# Patient Record
Sex: Female | Born: 1995 | Marital: Single | Smoking: Never smoker
Health system: Southern US, Community
[De-identification: ages and names within clinical notes are randomized; demographics above are authoritative.]

## PROBLEM LIST (undated history)

## (undated) DIAGNOSIS — R112 Nausea with vomiting, unspecified: Secondary | ICD-10-CM

## (undated) DIAGNOSIS — F32A Depression, unspecified: Secondary | ICD-10-CM

## (undated) DIAGNOSIS — F329 Major depressive disorder, single episode, unspecified: Secondary | ICD-10-CM

## (undated) DIAGNOSIS — F419 Anxiety disorder, unspecified: Secondary | ICD-10-CM

## (undated) DIAGNOSIS — Z9889 Other specified postprocedural states: Secondary | ICD-10-CM

## (undated) HISTORY — PX: FOOT SURGERY: SHX648

---
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
## 2015-02-17 NOTE — L&D Delivery Note (Signed)
[id number] - Pt called for possible SROM, in to assist pt to BR. Pt delivered a non viable fetus in BR at 0058. Dr. Genevie AnnSchenk called to evaluate in person.

## 2016-02-01 ENCOUNTER — Inpatient Hospital Stay (HOSPITAL_COMMUNITY)
Admission: AD | Admit: 2016-02-01 | Discharge: 2016-02-02 | Disposition: A | Discharge status: Home / Self Care | Payer: Managed Care, Other (non HMO) | Source: Ambulatory Visit | Attending: Obstetrics & Gynecology | Admitting: Obstetrics & Gynecology

## 2016-02-01 ENCOUNTER — Inpatient Hospital Stay (HOSPITAL_COMMUNITY): Payer: Managed Care, Other (non HMO)

## 2016-02-01 ENCOUNTER — Encounter (HOSPITAL_COMMUNITY): Payer: Self-pay | Admitting: *Deleted

## 2016-02-01 DIAGNOSIS — O039 Complete or unspecified spontaneous abortion without complication: Secondary | ICD-10-CM

## 2016-02-01 DIAGNOSIS — Z3A16 16 weeks gestation of pregnancy: Secondary | ICD-10-CM | POA: Insufficient documentation

## 2016-02-01 DIAGNOSIS — O021 Missed abortion: Secondary | ICD-10-CM | POA: Insufficient documentation

## 2016-02-01 DIAGNOSIS — O283 Abnormal ultrasonic finding on antenatal screening of mother: Secondary | ICD-10-CM

## 2016-02-01 HISTORY — DX: Nausea with vomiting, unspecified: R11.2

## 2016-02-01 HISTORY — DX: Depression, unspecified: F32.A

## 2016-02-01 HISTORY — DX: Anxiety disorder, unspecified: F41.9

## 2016-02-01 HISTORY — DX: Major depressive disorder, single episode, unspecified: F32.9

## 2016-02-01 HISTORY — DX: Other specified postprocedural states: Z98.890

## 2016-02-01 LAB — CBC
HCT: 39.7 % (ref 36.0–46.0)
Hemoglobin: 13.8 g/dL (ref 12.0–15.0)
MCH: 29.3 pg (ref 26.0–34.0)
MCHC: 34.8 g/dL (ref 30.0–36.0)
MCV: 84.3 fL (ref 78.0–100.0)
PLATELETS: 283 10*3/uL (ref 150–400)
RBC: 4.71 MIL/uL (ref 3.87–5.11)
RDW: 13.1 % (ref 11.5–15.5)
WBC: 10 10*3/uL (ref 4.0–10.5)

## 2016-02-01 LAB — TYPE AND SCREEN
ABO/RH(D): O POS
ANTIBODY SCREEN: NEGATIVE

## 2016-02-01 LAB — ABO/RH: ABO/RH(D): O POS

## 2016-02-01 IMAGING — US US MFM OB LIMITED
1 series · 15 of 28 positions shown · non-contrast
Comparison: none

[Series 1: us mfm ob limited · 15 of 33 slices shown]
[im 1/33]
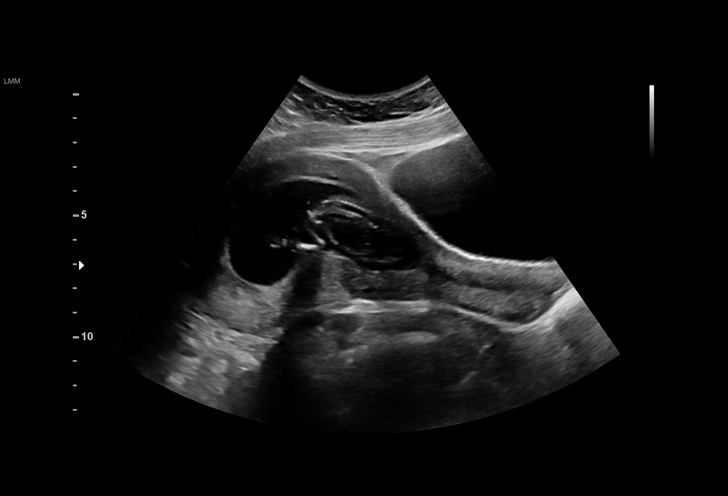
[im 3/33]
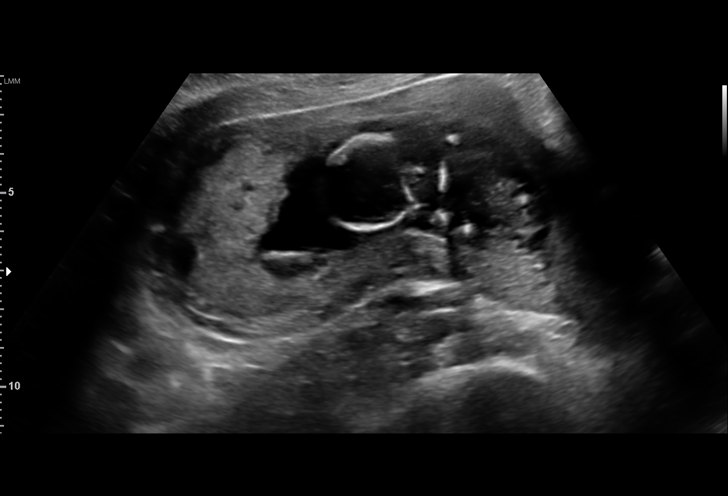
[im 5/33]
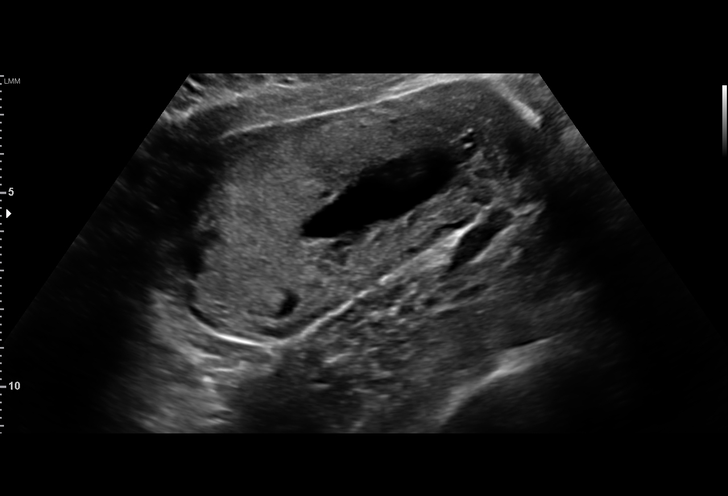
[im 8/33]
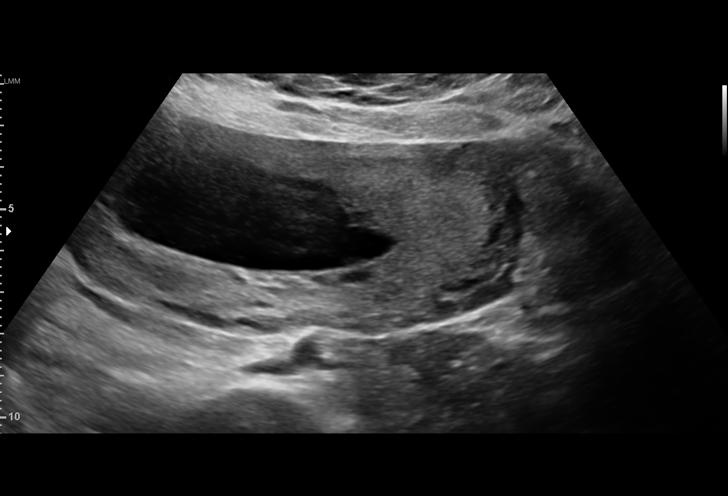
[im 10/33]
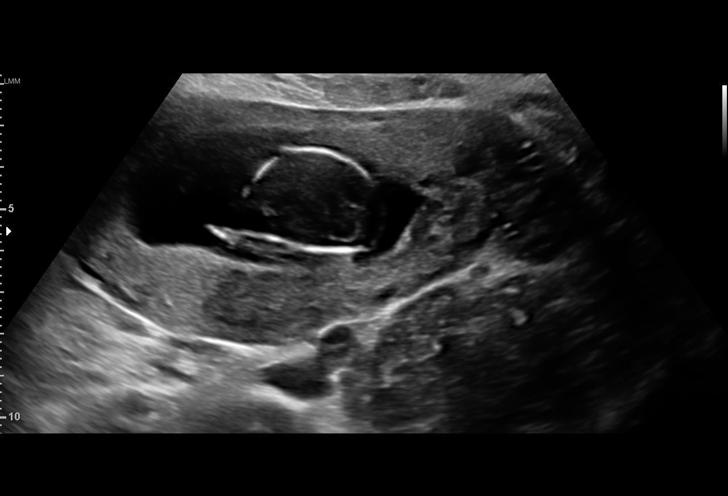
[im 12/33]
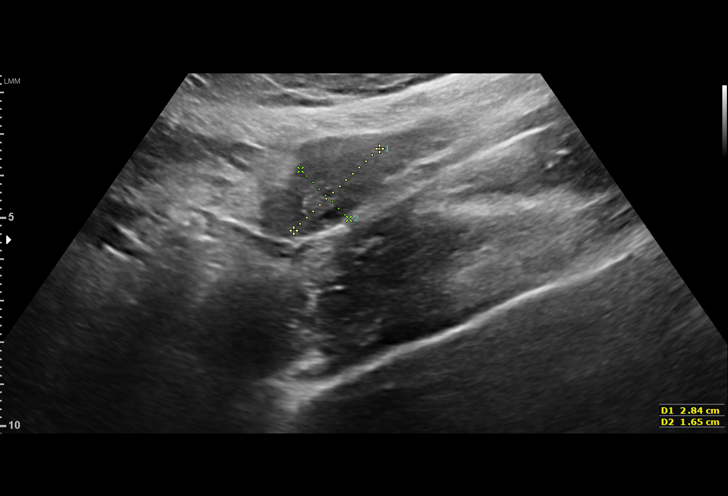
[im 15/33]
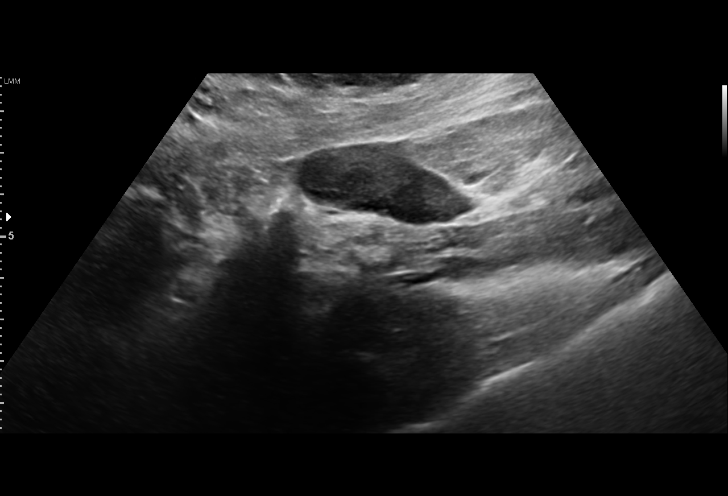
[im 17/33]
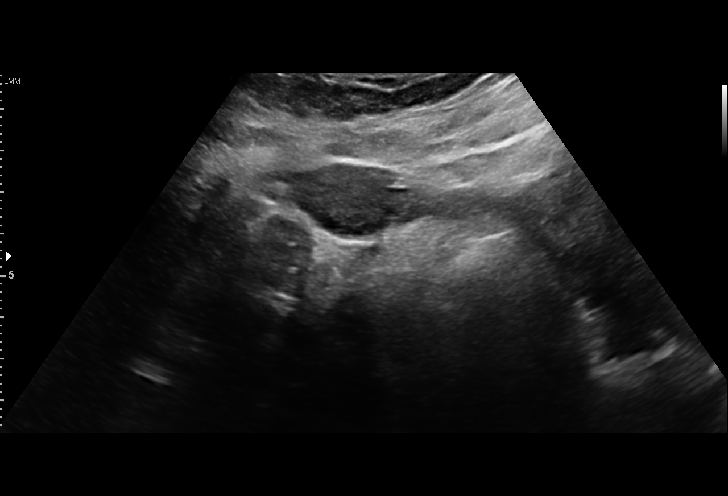
[im 18/33]
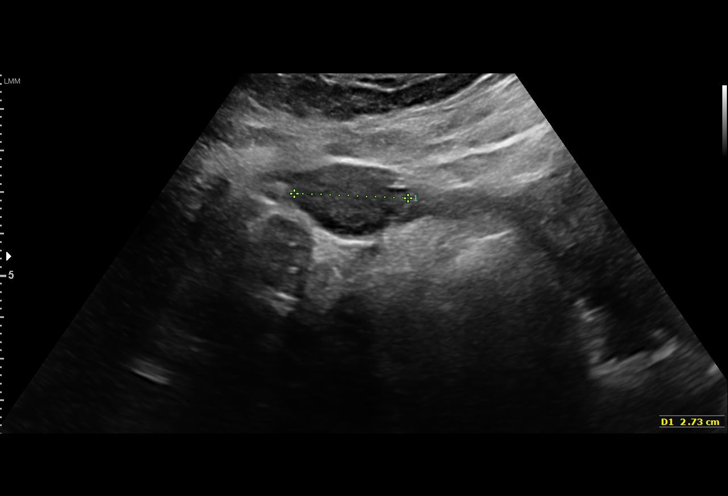
[im 21/33]
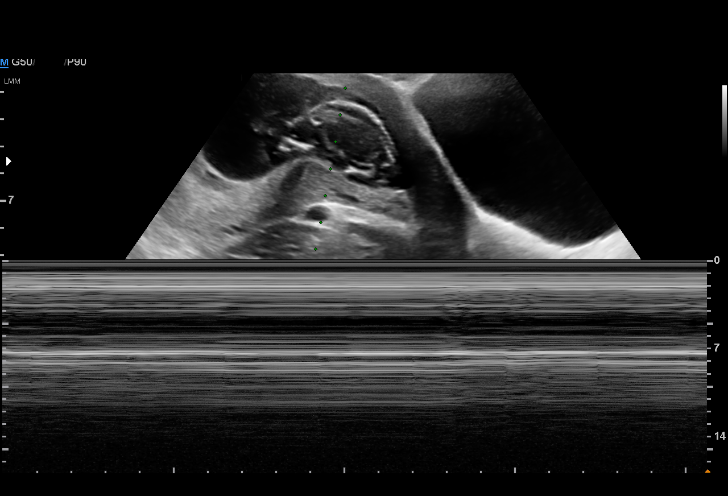
[im 23/33]
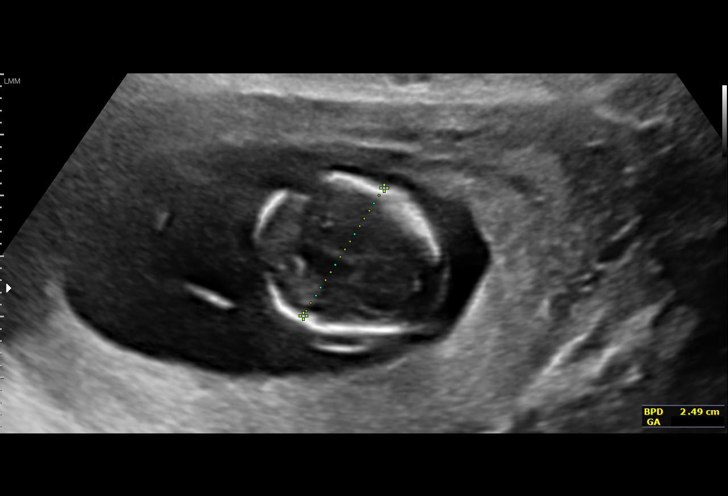
[im 25/33]
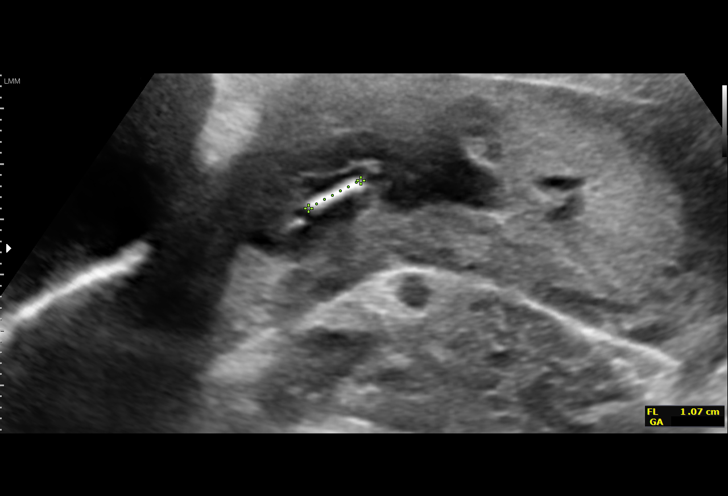
[im 28/33]
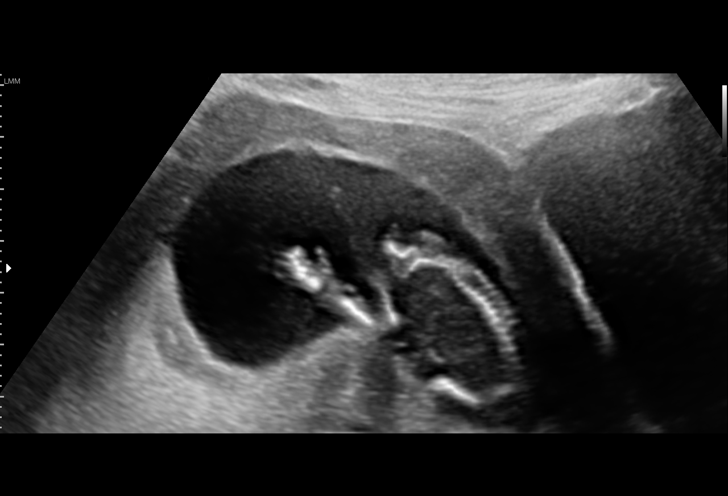
[im 30/33]
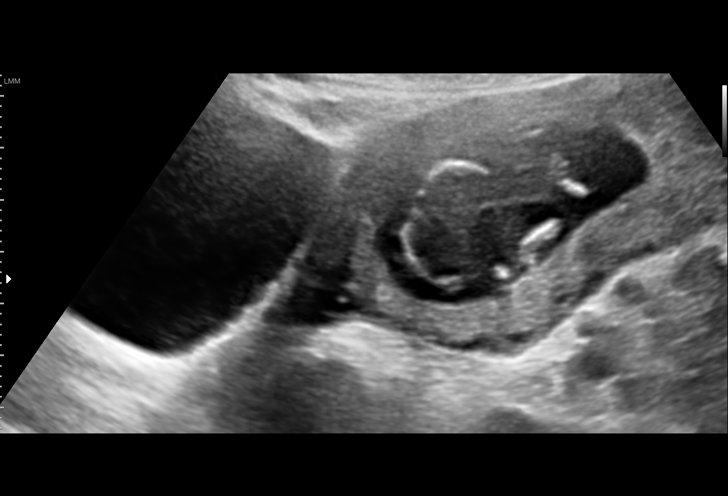
[im 33/33]
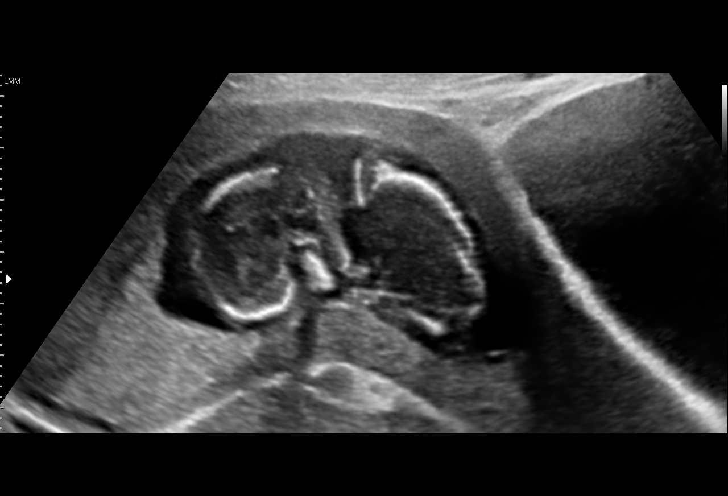

[15 of 28 positions shown; findings below may reference images not displayed]

Attending:        JURUBEBA        Secondary Phy.:    JURUBEBA Nursing-
MAU/Triage

Indications

16 weeks gestation of pregnancy
Absent fetal heart tones                       O76
OB History

Gravidity:    3          SAB:   2
Living:       0
Fetal Evaluation

Num Of Fetuses:     1
Cardiac Activity:   Absent
Presentation:       Breech
Placenta:           Fundal, above cervical os

Amniotic Fluid
AFI FV:      Subjectively within normal limits
Biometry

BPD:      24.8  mm     G. Age:  14w 2d          1  %
FL/BPD:      42.3  %
FL:       10.5  mm     G. Age:  13w 1d        < 3  %
Gestational Age

LMP:           16w 0d        Date:  [DATE]                 EDD:   [DATE]
U/S Today:     13w 5d                                        EDD:   [DATE]
Best:          16w 0d     Det. By:  LMP  ([DATE])          EDD:   [DATE]
Cervix Uterus Adnexa

Cervix
Normal appearance by transabdominal scan.

Uterus
No abnormality visualized.

Left Ovary
Within normal limits.

Right Ovary
Within normal limits.

Cul De Sac:   No free fluid seen.

Adnexa:       No abnormality visualized.
Impression

Single IUP at 16w 0d
BPD and FL consistent with 13w 5d
Absent fetal heart tones - consistent with intrauterine fetal
demise
Recommendations

Follow up ultrasounds as clinically indicated

## 2016-02-01 MED ORDER — SODIUM CHLORIDE 0.9% FLUSH
3.0000 mL | Freq: Two times a day (BID) | INTRAVENOUS | Status: DC
  Administered 2016-02-01 – 2016-02-02 (×2): 3 mL via INTRAVENOUS

## 2016-02-01 MED ORDER — SODIUM CHLORIDE 0.9% FLUSH: 3.0000 mL | INTRAVENOUS | Status: DC | PRN

## 2016-02-01 MED ORDER — MISOPROSTOL 200 MCG PO TABS
800.0000 ug | ORAL_TABLET | Freq: Once | ORAL | Status: AC
  Administered 2016-02-01: 800 ug via VAGINAL
  Filled 2016-02-01: qty 4

## 2016-02-01 MED ORDER — SODIUM CHLORIDE 0.9 % IV SOLN: 250.0000 mL | INTRAVENOUS | Status: DC | PRN

## 2016-02-01 MED ORDER — ZOLPIDEM TARTRATE 5 MG PO TABS: 5.0000 mg | ORAL_TABLET | Freq: Every evening | ORAL | Status: DC | PRN

## 2016-02-01 MED ORDER — ACETAMINOPHEN 325 MG PO TABS
650.0000 mg | ORAL_TABLET | ORAL | Status: DC | PRN
  Administered 2016-02-01: 650 mg via ORAL
  Filled 2016-02-01: qty 2

## 2016-02-01 MED ORDER — DOCUSATE SODIUM 100 MG PO CAPS: 100.0000 mg | ORAL_CAPSULE | Freq: Every day | ORAL | Status: DC

## 2016-02-01 MED ORDER — CALCIUM CARBONATE ANTACID 500 MG PO CHEW: 2.0000 | CHEWABLE_TABLET | ORAL | Status: DC | PRN

## 2016-02-01 MED ORDER — FENTANYL CITRATE (PF) 100 MCG/2ML IJ SOLN
100.0000 ug | INTRAMUSCULAR | Status: DC | PRN
  Administered 2016-02-01 – 2016-02-02 (×6): 100 ug via INTRAVENOUS
  Filled 2016-02-01 (×6): qty 2

## 2016-02-01 MED ORDER — SERTRALINE HCL 50 MG PO TABS
50.0000 mg | ORAL_TABLET | Freq: Every day | ORAL | Status: DC
  Administered 2016-02-01: 50 mg via ORAL
  Filled 2016-02-01: qty 1

## 2016-02-01 MED ORDER — MISOPROSTOL 200 MCG PO TABS
400.0000 ug | ORAL_TABLET | Freq: Once | ORAL | Status: AC
  Administered 2016-02-02: 400 ug via VAGINAL
  Filled 2016-02-01: qty 2

## 2016-02-01 NOTE — MAU Note (Signed)
Pt presents to MAU after [facility] for gender reveal and states was told by ultrasound tech they could not find a heart rate. Denies any vaginal bleeding or pain. States she had a subchronic hemorrhage at [redacted] weeks gestation that resolved

## 2016-02-01 NOTE — Progress Notes (Signed)
Patient requested to eat prior to the placement of cytotec.  Family is at bedside.  Chaplain was paged.

## 2016-02-01 NOTE — Progress Notes (Signed)
Chaplain paged to provide grief support and care to Veronica Wolfe after the news that her child was a fetal demise. Veronica Wolfe was surrounded by mother, sisters and SO. She went to heartstrings to find what gender her child was and it was discovered there was no heart beat. The other shock came when she was told that the child was six weeks less developed that she had thought, therefore rather than [redacted] weeks pregnant she is no more than [redacted] weeks pregnant. Grief counsel was limited to the initial shock she is feeling now. I will follow up on Sunday when she has had time to absorb her loss. Her SO was present but unemotional.  Page Chaplain if Veronica Wolfe needs further Spiritual Care prior to rounding on the 17th of Dec.  Darice Vicario D. Batsheva Stevick, DMin Chaplain

## 2016-02-01 NOTE — MAU Provider Note (Signed)
Chief Complaint: No chief complaint on file.   None     SUBJECTIVE HPI: Veronica Wolfe is a 20 y.o. G3P0020 at 1941w0d by LMP who presents to maternity admissions reporting [facility] and was told there was no fetal heartbeat.  [place] [facility] [place] but was in [place] when she received this news. She denies pain or bleeding.  Her pregnancy has been uncomplicated with hx of 2 previous SABs in the first trimester. She denies vaginal bleeding, vaginal itching/burning, urinary symptoms, h/a, dizziness, n/v, or fever/chills.     HPI  History reviewed. No pertinent past medical history. Past Surgical History:  Procedure Laterality Date  . FOOT SURGERY     Social History   Social History  . Marital status: Single    Spouse name: N/A  . Number of children: N/A  . Years of education: N/A   Occupational History  . Not on file.   Social History Main Topics  . Smoking status: Never Smoker  . Smokeless tobacco: Never Used  . Alcohol use Not on file  . Drug use: No  . Sexual activity: Yes    Birth control/ protection: None   Other Topics Concern  . Not on file   Social History Narrative  . No narrative on file   No current facility-administered medications on file prior to encounter.    No current outpatient prescriptions on file prior to encounter.   No Known Allergies  ROS:  Review of Systems  Constitutional: Negative for chills, fatigue and fever.  Eyes: Negative for visual disturbance.  Respiratory: Negative for shortness of breath.   Cardiovascular: Negative for chest pain.  Gastrointestinal: Negative for abdominal pain, nausea and vomiting.  Genitourinary: Negative for difficulty urinating, dysuria, flank pain, pelvic pain, vaginal bleeding, vaginal discharge and vaginal pain.  Neurological: Negative for dizziness and headaches.  Psychiatric/Behavioral: Negative.      I have reviewed  patient's Past Medical Hx, Surgical Hx, Family Hx, Social Hx, medications and allergies.   Physical Exam   Patient Vitals for the past 24 hrs:  BP Temp Pulse Resp SpO2  02/01/16 1045 122/70 97.9 F (36.6 C) 120 18 100 %   Constitutional: Well-developed, well-nourished female in no acute distress.  Cardiovascular: normal rate Respiratory: normal effort GI: Abd soft, non-tender. Pos BS x 4 MS: Extremities nontender, no edema, normal ROM Neurologic: Alert and oriented x 4.  GU: Neg CVAT.    LAB RESULTS No results found for this or any previous visit (from the past 24 hour(s)).     IMAGING No results found.  Bedside US with no visible FHR, formal US ordered and completed at bedside for confirmation.  Measuring less than dates, at 13-14 weeks per ultrasound.  MAU Management/MDM: Ordered US and reviewed results.  Discussed findings with pt as definitive for fetal demise, likely occurring a few weeks ago according to today's measurements.  Discussed options including expectant management and induction of labor with Cytotec, as well as possible surgical options for D&E depending on final measurements of CRL/dating.  Pt does not desire surgery. She is unsure if she wants to stay here in [place] or [place].  Pt and family given time to discuss and decide. Questions answered. Consult Dr Debroah LoopArnold.  Pt desires to stay to [facility].  Admit to HROB 3rd floor.  Pt stable at time of transfer.  ASSESSMENT 1.  Abnormal pregnancy US   2. Fetal demise due to miscarriage     PLAN Admit to HROB 3rd floor Cytotec 800 mcg vaginally x 1 dose Saline lock with Fentanyl 100 mcg Q 1 hour PRN Admission labs drawn    Sharen CounterLisa Leftwich-Kirby Certified Nurse-Midwife 02/01/2016  2:17 PM

## 2016-02-02 ENCOUNTER — Encounter (HOSPITAL_COMMUNITY): Payer: Self-pay | Admitting: Certified Registered Nurse Anesthetist

## 2016-02-02 ENCOUNTER — Encounter (HOSPITAL_COMMUNITY)
Admission: AD | Disposition: A | Discharge status: Home / Self Care | Payer: Self-pay | Source: Ambulatory Visit | Attending: Obstetrics & Gynecology

## 2016-02-02 DIAGNOSIS — O021 Missed abortion: Secondary | ICD-10-CM

## 2016-02-02 SURGERY — DILATION AND EVACUATION, UTERUS
Anesthesia: Choice

## 2016-02-02 MED ORDER — MISOPROSTOL 200 MCG PO TABS
400.0000 ug | ORAL_TABLET | Freq: Once | ORAL | Status: AC
  Administered 2016-02-02: 400 ug via ORAL
  Filled 2016-02-02: qty 2

## 2016-02-02 MED ORDER — MISOPROSTOL 200 MCG PO TABS
ORAL_TABLET | ORAL | Status: AC
  Filled 2016-02-02: qty 2

## 2016-02-02 MED ORDER — ONDANSETRON HCL 4 MG/2ML IJ SOLN
4.0000 mg | Freq: Four times a day (QID) | INTRAMUSCULAR | Status: DC | PRN
  Administered 2016-02-02: 4 mg via INTRAVENOUS
  Filled 2016-02-02: qty 2

## 2016-02-02 MED ORDER — IBUPROFEN 600 MG PO TABS: 600.0000 mg | ORAL_TABLET | Freq: Four times a day (QID) | ORAL | 1 refills | Status: AC | PRN

## 2016-02-02 NOTE — H&P (Signed)
None     SUBJECTIVE HPI: Veronica Wolfe is a 20 y.o. G3P0020 at 7724w0d by LMP who presents to maternity admissions reporting [facility] and was told there was no fetal heartbeat.  She [place] [facility] [place] [facility] [place] but was in [place] when she received this news. She denies pain or bleeding.  Her pregnancy has been uncomplicated with hx of 2 previous SABs in the first trimester. She denies vaginal bleeding, vaginal itching/burning, urinary symptoms, h/a, dizziness, n/v, or fever/chills.     HPI  Past Medical History:  Diagnosis Date  . Anxiety   . Depression   . PONV (postoperative nausea and vomiting)    Past Surgical History:  Procedure Laterality Date  . FOOT SURGERY     Social History   Social History  . Marital status: Single    Spouse name: N/A  . Number of children: N/A  . Years of education: N/A   Occupational History  . Not on file.   Social History Main Topics  . Smoking status: Never Smoker  . Smokeless tobacco: Never Used  . Alcohol use Not on file  . Drug use: No  . Sexual activity: Yes    Birth control/ protection: None   Other Topics Concern  . Not on file   Social History Narrative  . No narrative on file   No current facility-administered medications on file prior to encounter.    No current outpatient prescriptions on file prior to encounter.   No Known Allergies  ROS:  Review of Systems  Constitutional: Negative for chills, fatigue and fever.  Eyes: Negative for visual disturbance.  Respiratory: Negative for shortness of breath.   Cardiovascular: Negative for chest pain.  Gastrointestinal: Negative for abdominal pain, nausea and vomiting.  Genitourinary: Negative for difficulty urinating, dysuria, flank pain, pelvic pain, vaginal bleeding, vaginal discharge and vaginal pain.  Neurological: Negative for dizziness and headaches.  Psychiatric/Behavioral: Negative.      I  have reviewed patient's Past Medical Hx, Surgical Hx, Family Hx, Social Hx, medications and allergies.   Physical Exam   Patient Vitals for the past 24 hrs:  BP Temp Temp src Pulse Resp SpO2 Height Weight  02/02/16 0602 110/68 98.1 F (36.7 C) Oral 90 17 - - -  02/02/16 0532 114/77 - - (!) 102 - - - -  02/02/16 0502 102/60 - - 82 - - - -  02/02/16 0432 (!) 101/54 - - 79 - - - -  02/02/16 0410 - 98.3 F (36.8 C) Oral - - - - -  02/02/16 0402 114/69 - - 80 - - - -  02/02/16 0332 121/71 - - 91 - - - -  02/02/16 0300 126/74 - - 97 - - - -  02/02/16 0232 112/66 - - 88 - - - -  02/02/16 0202 123/77 - - 95 - - - -  02/02/16 0144 119/72 - - 98 - - - -  02/02/16 0103 115/65 - - (!) 114 - - - -  02/02/16 0025 120/73 98.1 F (36.7 C) Oral (!) 114 17 - - -  02/01/16 1930 110/60 98.5 F (36.9 C) Oral - 17 - - -  02/01/16 1640 (!) 103/58 99 F (37.2 C) Oral (!) 101 16 - - -  02/01/16 1441 123/67 98.2 F (36.8 C) Oral (!) 122 16 - 5\' 5"  (1.651 m) 68 kg (150 lb)  02/01/16 1045 122/70 97.9 F (36.6 C) -  120 18 100 % - -   Constitutional: Well-developed, well-nourished female in no acute distress.  Cardiovascular: normal rate Respiratory: normal effort GI: Abd soft, non-tender. Pos BS x 4 MS: Extremities nontender, no edema, normal ROM Neurologic: Alert and oriented x 4.  GU: Neg CVAT.    LAB RESULTS Results for orders placed or performed during the hospital encounter of 02/01/16 (from the past 24 hour(s))  Type and screen [facility]     Status: None   Collection Time: 02/01/16  1:55 PM  Result Value Ref Range   ABO/RH(D) O POS    Antibody Screen NEG    Sample Expiration 02/04/2016   CBC on admission     Status: None   Collection Time: 02/01/16  1:55 PM  Result Value Ref Range   WBC 10.0 4.0 - 10.5 K/uL   RBC 4.71 3.87 - 5.11 MIL/uL   Hemoglobin 13.8 12.0 - 15.0 g/dL   HCT 40.939.7 81.136.0 - 91.446.0 %   MCV 84.3 78.0 - 100.0 fL   MCH 29.3 26.0 - 34.0 pg   MCHC 34.8  30.0 - 36.0 g/dL   RDW 78.213.1 95.611.5 - 21.315.5 %   Platelets 283 150 - 400 K/uL  ABO/Rh     Status: None   Collection Time: 02/01/16  1:55 PM  Result Value Ref Range   ABO/RH(D) O POS     --/--/O POS, O POS (12/16 1355)  IMAGING No results found.  Bedside US with no visible FHR, formal US ordered and completed at bedside for confirmation.  Measuring less than dates, at 13-14 weeks per ultrasound.  MAU Management/MDM: Ordered US and reviewed results.  Discussed findings with pt as definitive for fetal demise, likely occurring a few weeks ago according to today's measurements.  Discussed options including expectant management and induction of labor with Cytotec, as well as possible surgical options for D&E depending on final measurements of CRL/dating.  Pt does not desire surgery. She is unsure if she wants to stay here in [place] or [place] [facility] [place].  Pt and family given time to discuss and decide. Questions answered. Consult Dr Debroah LoopArnold.  Pt desires to stay to [facility].  Admit to HROB 3rd floor.  Pt stable at time of transfer.  ASSESSMENT 1. Abnormal pregnancy US   2. Fetal demise due to miscarriage     PLAN Admit to HROB 3rd floor Cytotec 800 mcg vaginally x 1 dose Saline lock with Fentanyl 100 mcg Q 1 hour PRN Admission labs drawn  Attestation of Attending Supervision of Advanced Practitioner (CNM/NP/PA): Evaluation and management procedures were performed by the Advanced Practitioner under my supervision and collaboration. I have reviewed the Advanced Practitioner's note and chart, and I agree with the management and plan.  Scheryl DarterJames Luella Gardenhire MD

## 2016-02-02 NOTE — Discharge Summary (Signed)
Physician Discharge Summary  Patient ID: Veronica Wolfe MRN: [id number] DOB/AGE: 20/09/1995 20 y.o.  Admit date: 02/01/2016 Discharge date: 02/02/2016  Admission Diagnoses:missed abortion 13-16 wks   Discharge Diagnoses:  Active Problems:   Missed abortion   Discharged Condition: good  Hospital Course: admitted for cytotec induction, delivered a small macerated fetus, 1 am, then the placenta removed intact at 10 am after cytotec p.o. Blood type o pos  Consults: None  Significant Diagnostic Studies: labs: o pos, CBC    Component Value Date/Time   WBC 10.0 02/01/2016 1355   RBC 4.71 02/01/2016 1355   HGB 13.8 02/01/2016 1355   HCT 39.7 02/01/2016 1355   PLT 283 02/01/2016 1355   MCV 84.3 02/01/2016 1355   MCH 29.3 02/01/2016 1355   MCHC 34.8 02/01/2016 1355   RDW 13.1 02/01/2016 1355     Treatments: cytotec , ring forceps extraction of placenta.  Discharge Exam: Blood pressure 109/66, pulse 90, temperature 98.3 F (36.8 C), temperature source Oral, resp. rate 16, height 5\' 5"  (1.651 m), weight 68 kg (150 lb), last menstrual period 10/12/2015, SpO2 100 %. General appearance: alert, cooperative and mild distress Head: Normocephalic, without obvious abnormality, atraumatic GI: soft, non-tender; bowel sounds normal; no masses,  no organomegaly Pelvic: cervix normal in appearance and external genitalia normal    Resolved bleeding, ebl 400 cc Disposition: 01-Home or Self Care  Discharge Instructions    Diet - low sodium heart healthy    Complete by:  As directed    Increase activity slowly    Complete by:  As directed      Allergies as of 02/02/2016   No Known Allergies     Medication List    TAKE these medications   ibuprofen 600 MG tablet Commonly known as:  ADVIL,MOTRIN Take 1 tablet (600 mg total) by mouth every 6 (six) hours as needed.   PRENATAL GUMMIES/DHA & FA 0.4-32.5 MG Chew Chew 2 each by mouth at bedtime.   sertraline 50 MG tablet Commonly  known as:  ZOLOFT Take 50 mg by mouth at bedtime.      Follow-up Information    [facility] Follow up.           SignedTilda Burrow: Jodel Mayhall V 02/02/2016, 10:41 AM

## 2016-02-02 NOTE — Progress Notes (Signed)
Subjective: Patient reports cramping and vaginal bleeding, s/p passage of fetus  Objective: I have reviewed patient's vital signs and medications.  General: alert, cooperative and no distress GI: abdomen not distended   Assessment/Plan: Missed abortion s/p induced termination without passage of placenta. Will add pitocin to IV and if placenta is not delivered she will be posted for suction D&C.  LOS: 0 days    Veronica DarterJames Ritha Wolfe 02/02/2016, 6:20 AM

## 2016-02-02 NOTE — Progress Notes (Signed)
Placenta delivered by Dr Emelda FearFerguson using speculum and ring forceps.

## 2016-02-02 NOTE — Progress Notes (Signed)
Dr Emelda FearFerguson at bedside, discussing plan of care.  SVE done and 1 large clot removed measuring about 100ml.

## 2016-02-02 NOTE — Progress Notes (Signed)
Patient seen. Called to floor by nurse stating that patients water broke and she delivered the infant. She has no bleeding at this time. She did not deliver the placenta. Patient received Cytotec at approximately midnight. Will continue to monitor for passage of placenta. Nurse to call at 530 if placenta has not passed or if patient develops significant bleeding. Cervix checked and is 1.5/thick.

## 2016-02-02 NOTE — Progress Notes (Addendum)
Chaplain paged after the delivery of Ms Ellingson child. Ms Carin HockSuggs is going through the opening stages of grief over the death of her child. The baby was delivered after an estimated 10 week pregnancy. Ms Carin HockSuggs was encouraged to name the child to that the memory of this child will not be assigned to generalized terms. At present she is resisting this idea. 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 She is hoping to return to her studies. Prayer and grief counsel were provided.  The care giving by the [facility] team with pictures and grief support is appreciated by Ms Carin HockSuggs and her family.  Visiting with Ms Carin HockSuggs is her SO. her mother and a best friend. All were very support of Ms Carin HockSuggs. The SO was fully engaged in grief support and caring.   At present there are plans to have the remains of this child to be buried near Ms Wallene DalesSugg's deceased father or grandfather.  Page the chaplain if there is every a need for any further spiritual care.  Benjie Karvonenharles D. Jennalynn Rivard, DMin Chaplain

## 2016-02-02 NOTE — Progress Notes (Signed)
Patient ID: Roosvelt HarpsGabrielle Lanter, female   DOB: 06/02/1995, 20 y.o.   MRN: [id number]  Patient examined, found to have 400 cc of clots per vagina at 9 am, evacuated, and tissue palpable in cervical os. Pt agrees to attempt at extraction of tissue, counselled  Exam with speculum reveals tissue, which is extracted from cervix/ uterus with ring forceps and valsalva, intact, with no residual tissue found in cervix/uterus on probing.  IMP : completed miscarriage. Blood type O pos. Plan:  Contraception discussed; pt has ocp's to begin by Wednesday F/U 2 wk with OB in [place].

## 2016-02-02 NOTE — Discharge Instructions (Signed)

## 2016-02-02 NOTE — Progress Notes (Signed)
Fetus weighted 0.035g,  Length 4.5inches, pictures taken. Chaplin at bedside now.

## 2016-12-06 ENCOUNTER — Encounter (HOSPITAL_COMMUNITY): Payer: Self-pay
